# Patient Record
Sex: Male | Born: 2000 | Race: Black or African American | Hispanic: No | Marital: Single | Smoking: Current every day smoker
Health system: Southern US, Community
[De-identification: ages and names within clinical notes are randomized; demographics above are authoritative.]

## PROBLEM LIST (undated history)

## (undated) DIAGNOSIS — R509 Fever, unspecified: Secondary | ICD-10-CM

## (undated) HISTORY — PX: SKIN GRAFT: SHX250

---
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
## 2022-01-12 ENCOUNTER — Ambulatory Visit (INDEPENDENT_AMBULATORY_CARE_PROVIDER_SITE_OTHER): Payer: Medicaid Other | Admitting: Pediatric Endocrinology

## 2022-02-11 ENCOUNTER — Other Ambulatory Visit (INDEPENDENT_AMBULATORY_CARE_PROVIDER_SITE_OTHER): Payer: Self-pay | Admitting: Pediatric Endocrinology

## 2022-02-24 ENCOUNTER — Encounter: Payer: Self-pay | Admitting: *Deleted

## 2022-03-06 ENCOUNTER — Other Ambulatory Visit (INDEPENDENT_AMBULATORY_CARE_PROVIDER_SITE_OTHER): Payer: Self-pay | Admitting: Pediatric Endocrinology

## 2022-03-06 DIAGNOSIS — E1110 Type 2 diabetes mellitus with ketoacidosis without coma: Secondary | ICD-10-CM

## 2022-03-29 ENCOUNTER — Other Ambulatory Visit (INDEPENDENT_AMBULATORY_CARE_PROVIDER_SITE_OTHER): Payer: Self-pay | Admitting: Pediatric Endocrinology

## 2022-03-29 DIAGNOSIS — E1110 Type 2 diabetes mellitus with ketoacidosis without coma: Secondary | ICD-10-CM

## 2022-04-21 ENCOUNTER — Other Ambulatory Visit: Payer: Self-pay

## 2022-04-21 ENCOUNTER — Encounter (INDEPENDENT_AMBULATORY_CARE_PROVIDER_SITE_OTHER): Payer: Self-pay | Admitting: Pediatric Endocrinology

## 2022-04-21 ENCOUNTER — Ambulatory Visit (INDEPENDENT_AMBULATORY_CARE_PROVIDER_SITE_OTHER): Payer: Medicaid Other | Admitting: Pediatric Endocrinology

## 2022-04-21 ENCOUNTER — Other Ambulatory Visit: Payer: Self-pay | Admitting: Family

## 2022-04-21 VITALS — BP 120/80 | HR 96 | Ht 68.5 in | Wt 150.0 lb

## 2022-04-21 DIAGNOSIS — Z113 Encounter for screening for infections with a predominantly sexual mode of transmission: Secondary | ICD-10-CM

## 2022-04-21 DIAGNOSIS — E11649 Type 2 diabetes mellitus with hypoglycemia without coma: Secondary | ICD-10-CM

## 2022-04-21 DIAGNOSIS — E119 Type 2 diabetes mellitus without complications: Secondary | ICD-10-CM

## 2022-04-21 LAB — POCT GLYCOSYLATED HEMOGLOBIN (HGB A1C): Hemoglobin A1C: 5.4 % (ref 4.0–5.6)

## 2022-04-21 LAB — POCT GLUCOSE (DEVICE FOR HOME USE): POC Glucose: 115 mg/dl — AB (ref 70–99)

## 2022-04-21 MED ORDER — BAQSIMI TWO PACK 3 MG/DOSE NA POWD: 1.0000 | NASAL | 3 refills | Status: AC | PRN

## 2022-04-21 MED ORDER — TRULICITY 3 MG/0.5ML ~~LOC~~ SOAJ
3.0000 mg | SUBCUTANEOUS | 5 refills | Status: DC
Start: 2022-04-21 — End: 2022-09-23

## 2022-04-21 NOTE — Progress Notes (Signed)
Subjective:  Subjective  Patient Name: Rodney Perez Date of Birth: 2001-09-14  MRN: [id number]  Rodney Perez  presents to the office today for follow-upevaluation and management of his new onset diabetes.   HISTORY OF PRESENT ILLNESS:   Rodney Perez is a 21 y.o. male   [name] was unaccompanied to his visit today  1. Rodney Perez was admitted to [facility] on 12/02/19. He had lost about 20 pounds over the prior 3-4 months. He was seen in the ED at [facility] on 12/01/19 for jaw pain- where his glucose was 594 with 20 ketones on UA. He was given 10 units of Novolog in the ED and discharged on Metformin 500 BID. He returned to the ED later the same day due to concerns for hyperglycemia and was transferred to [facility] for evaluation of New Onset diabetes.   2. Rodney Perez was last seen in pediatric endocrine clinic on 06/23/21 In the interim he has been doing well.   He has continued on Trulicity 1.5 mg weekly. He has enjoyed being on GLP-1 therapy instead of insulin.   He has continued on Dexcom G6. He is having issues with dress code at his job.   He is no longer having issues with urinating at night.    3. Pertinent Review of Systems:  Constitutional: The patient feels "fine". The patient seems healthy and active. Eyes: Vision seems to be good. There are no recognized eye problems.  Neck: The patient has no complaints of anterior neck swelling, soreness, tenderness, pressure, discomfort, or difficulty swallowing.   Heart: Heart rate increases with exercise or other physical activity. The patient has no complaints of palpitations, irregular heart beats, chest pain, or chest pressure.   Lungs: No asthma or wheezing. Gastrointestinal: Bowel movents seem normal. The patient has no complaints of excessive hunger, acid reflux, upset stomach, stomach aches or pains, diarrhea, or constipation.  Legs: Muscle mass and strength seem normal. There are no complaints of numbness, tingling, burning, or pain. No  edema is noted.  Feet: There are no obvious foot problems. There are no complaints of numbness, tingling, burning, or pain. No edema is noted. Neurologic: There are no recognized problems with muscle movement and strength, sensation, or coordination. GYN/GU: No polyuria. He is getting up once a night to urinate. No polydipsia  Diabetes alert:none - not yet.  Annual labs March 2022- no issues. Robust C-Peptide Hypoglycemia none        PAST MEDICAL, FAMILY, AND SOCIAL HISTORY   Past Medical History:  Diagnosis Date   Fever in pediatric patient    fevers of unknown origin (multiple episodes)   Neonatal abstinence syndrome     Family History  Problem Relation Age of Onset   Drug abuse Mother    Diabetes Mother    Heart Problems Mother      Current Outpatient Medications:    Continuous Blood Gluc Sensor (DEXCOM G6 SENSOR) MISC, QPPLY 1 SENSOR EVERY 10 DAYS AS DIRECTED, Disp: 3 each, Rfl: 11   Continuous Blood Gluc Transmit (DEXCOM G6 TRANSMITTER) MISC, USE EVERY 3 MONTHS, Disp: 1 each, Rfl: 0   Dulaglutide (TRULICITY) 3 IO/2.7OJ SOPN, Inject 3 mg into the skin once a week., Disp: 2 mL, Rfl: 5   Accu-Chek FastClix Lancets MISC, USE AS DIRECTED TO CHECK BLOOD SUGAR (Patient not taking: Reported on 04/21/2022), Disp: 204 each, Rfl: 5   acetone, urine, test strip, Check ketones per protocol (Patient not taking: Reported on 12/16/2020), Disp: 50 each, Rfl: 3   blood  glucose meter kit and supplies KIT, Dispense based on patient and insurance preference. Use up to four times daily as directed. (FOR ICD-9 250.00, 250.01). (Patient not taking: Reported on 04/21/2022), Disp: 1 each, Rfl: 0   Blood Glucose Monitoring Suppl (ACCU-CHEK GUIDE) w/Device KIT, 1 each by Does not apply route as directed. (Patient not taking: Reported on 04/21/2022), Disp: 1 kit, Rfl: 1   Glucagon ([name] TWO PACK) 3 MG/DOSE POWD, Place 1 each into the nose as needed (severe hypoglycmia with unresponsiveness)., Disp: 1  each, Rfl: 3   glucose blood (ACCU-CHEK GUIDE) test strip, USE TO CHECK BLOOD SUGAR 6 TIMES A DAY (Patient not taking: Reported on 04/21/2022), Disp: 200 strip, Rfl: 0   insulin lispro (HUMALOG KWIKPEN) 100 UNIT/ML KwikPen, INJECT UP TO 50 UNITS PER DAY (Patient not taking: Reported on 06/23/2021), Disp: 15 mL, Rfl: 5   LANTUS SOLOSTAR 100 UNIT/ML Solostar Pen, ADMINISTER UP TO 50 UNITS UNDER THE SKIN EVERY DAY (Patient not taking: Reported on 06/23/2021), Disp: 15 mL, Rfl: 5   metFORMIN (GLUCOPHAGE-XR) 500 MG 24 hr tablet, Take 1 tablet (500 mg total) by mouth daily with breakfast. (Patient not taking: Reported on 04/21/2022), Disp: 90 tablet, Rfl: 2  Allergies as of 04/21/2022 - Review Complete 04/21/2022  Allergen Reaction Noted   Cyproheptadine Other (See Comments) 01/22/2012     reports that he has been smoking cigarettes and e-cigarettes. He has been exposed to tobacco smoke. He has never used smokeless tobacco. He reports current drug use. Drug: Marijuana. He reports that he does not drink alcohol. Pediatric History  Patient Parents   CARTHEN,CHRISTOPHER (Father)   Carthen,Benita (Mother)   Other Topics Concern   Not on file  Social History Narrative   Lives at home with adoptive mom, mom's 2yo foster son, mom's 2 grandchildren (intermittently).  No pets in home.     1. School and Family: Graduated HS. Wants to be at [employer].  2. Activities: not active   3. Primary Care Provider: Wells Guiles, DO  ROS: There are no other significant problems involving Eliot's other body systems.    Objective:  Objective  Vital Signs:   BP 120/80 (BP Location: Left Arm, Patient Position: Sitting, Cuff Size: Large)   Pulse 96   Ht 5' 8.5" (1.74 m)   Wt 150 lb (68 kg)   BMI 22.47 kg/m    Blood pressure %iles are not available for patients who are 18 years or older.  Ht Readings from Last 3 Encounters:  04/21/22 5' 8.5" (1.74 m) (35 %, Z= -0.38)*  02/07/20 5' 8.62" (1.743 m) (43 %, Z= -0.19)*   01/03/20 5' 8.78" (1.747 m) (45 %, Z= -0.12)*   * Growth percentiles are based on CDC (Boys, 2-20 Years) data.   Wt Readings from Last 3 Encounters:  04/21/22 150 lb (68 kg) (43 %, Z= -0.18)*  06/23/21 158 lb (71.7 kg) (61 %, Z= 0.27)*  12/16/20 176 lb 9.6 oz (80.1 kg) (83 %, Z= 0.96)*   * Growth percentiles are based on CDC (Boys, 2-20 Years) data.   HC Readings from Last 3 Encounters:  No data found for [facility]   Body surface area is 1.81 meters squared. 35 %ile (Z= -0.38) based on CDC (Boys, 2-20 Years) Stature-for-age data based on Stature recorded on 04/21/2022. 43 %ile (Z= -0.18) based on CDC (Boys, 2-20 Years) weight-for-age data using vitals from 04/21/2022.  PHYSICAL EXAM:    Constitutional: The patient appears healthy and well nourished. The patient's weight  is decreased 18 pounds since March (6 months).  Head: The head is normocephalic. Face: The face appears normal. There are no obvious dysmorphic features. Eyes: The eyes appear to be normally formed and spaced. Gaze is conjugate. There is no obvious arcus or proptosis. Moisture appears normal. Ears: The ears are normally placed and appear externally normal. Neck: The neck appears to be visibly normal. The consistency of the thyroid gland is normal. The thyroid gland is not tender to palpation. Lungs: No increased work of breathing Heart: Heart rate, pulses, and peripheral perfusion normal Abdomen: The abdomen appears to be normal in size for the patient's age. Bowel sounds are normal. There is no obvious hepatomegaly, splenomegaly, or other mass effect.  Arms: Muscle size and bulk are normal for age. Hands: There is no obvious tremor. Phalangeal and metacarpophalangeal joints are normal. Palmar muscles are normal for age. Palmar skin is normal. Palmar moisture is also normal. Legs: Muscles appear normal for age. No edema is present. Feet: Feet are normally formed. Dorsalis pedal pulses are normal. Neurologic: Strength is normal  for age in both the upper and lower extremities. Muscle tone is normal. Sensation to touch is normal in both the legs and feet.    LAB DATA:   Lab Results  Component Value Date   HGBA1C 5.4 04/21/2022   HGBA1C 5.2 06/23/2021   HGBA1C 5.6 12/02/2020   HGBA1C >15.5 (H) 12/02/2019     Results for orders placed or performed in visit on 04/21/22  POCT Glucose (Device for Home Use)  Result Value Ref Range   Glucose Fasting, POC     POC Glucose 115 (A) 70 - 99 mg/dl  POCT glycosylated hemoglobin (Hb A1C)  Result Value Ref Range   Hemoglobin A1C 5.4 4.0 - 5.6 %   HbA1c POC (<> result, manual entry)     HbA1c, POC (prediabetic range)     HbA1c, POC (controlled diabetic range)           Assessment and Plan:  Assessment  ASSESSMENT: Jeremy is a 21 y.o. male with antibody negative diabetes. He was previously managed with insulin. However due to robust C-Peptide we transitioned him to GLP-1 therapy in March 2022.   Type 2 Diabetes - Reviewed Dexcom CGM in clinic today. His sugars have been more variable with a higher percentage above 180 (2% -> 4%) compared with his last visit. Will increase dose of Trulicity.  - Trulicity 1.5 mg weekly -> 41m weekly - POC CBG as above - POC A1C as above  Concern about STI - Will arrange for him to go to adolescent clinic today for STI testing - Will hold off on annual labs until next visit.   PLAN:   1. Diagnostic: CBG and A1C as above. Annual labs above from March 2022- will hold on labs as he is going to adolescent med for STI testing.  2. Therapeutic:  Meds ordered this encounter  Medications   Glucagon ([name] TWO PACK) 3 MG/DOSE POWD    Sig: Place 1 each into the nose as needed (severe hypoglycmia with unresponsiveness).    Dispense:  1 each    Refill:  3   Dulaglutide (TRULICITY) 3 MIB/7.0WUSOPN    Sig: Inject 3 mg into the skin once a week.    Dispense:  2 mL    Refill:  5    3. Patient education: Discussion as above.  4.  Follow-up: Return in about 3 months (around 07/22/2022).     JAnderson Malta  Baldo Ash, MD   LOS >30 minutes spent today reviewing the medical chart, counseling the patient/family, and documenting today's encounter.   Patient referred by Wells Guiles, DO for diabetes  Copy of this note sent to Wells Guiles, DO

## 2022-04-22 ENCOUNTER — Encounter (INDEPENDENT_AMBULATORY_CARE_PROVIDER_SITE_OTHER): Payer: Self-pay

## 2022-04-22 LAB — C. TRACHOMATIS/N. GONORRHOEAE RNA
C. trachomatis RNA, TMA: NOT DETECTED
N. gonorrhoeae RNA, TMA: NOT DETECTED

## 2022-04-25 ENCOUNTER — Other Ambulatory Visit (INDEPENDENT_AMBULATORY_CARE_PROVIDER_SITE_OTHER): Payer: Self-pay | Admitting: Pediatric Endocrinology

## 2022-04-25 DIAGNOSIS — E1110 Type 2 diabetes mellitus with ketoacidosis without coma: Secondary | ICD-10-CM

## 2022-04-28 ENCOUNTER — Ambulatory Visit: Payer: Medicaid Other | Admitting: Family

## 2022-05-24 ENCOUNTER — Other Ambulatory Visit (INDEPENDENT_AMBULATORY_CARE_PROVIDER_SITE_OTHER): Payer: Self-pay | Admitting: Pediatric Endocrinology

## 2022-05-28 ENCOUNTER — Other Ambulatory Visit (INDEPENDENT_AMBULATORY_CARE_PROVIDER_SITE_OTHER): Payer: Self-pay | Admitting: Pediatric Endocrinology

## 2022-05-28 DIAGNOSIS — E109 Type 1 diabetes mellitus without complications: Secondary | ICD-10-CM

## 2022-05-28 MED ORDER — ACCU-CHEK GUIDE VI STRP: ORAL_STRIP | 11 refills | Status: AC

## 2022-06-01 ENCOUNTER — Telehealth (INDEPENDENT_AMBULATORY_CARE_PROVIDER_SITE_OTHER): Payer: Self-pay

## 2022-06-01 NOTE — Telephone Encounter (Signed)
Received faxes from covermymeds stating that PA's for Dexcom G6 Sensors and Receiver is going to expire soon. Initiated PA's in covermymeds:  Receiver: Key: [id number]     Sensors:  Key: [id number]

## 2022-06-02 NOTE — Telephone Encounter (Addendum)
Receiver Key: [id number] APPROVED     Sensors Key: [id number]  APPROVED thru 06/01/2023

## 2022-07-22 ENCOUNTER — Telehealth (INDEPENDENT_AMBULATORY_CARE_PROVIDER_SITE_OTHER): Payer: Self-pay | Admitting: Pediatric Endocrinology

## 2022-07-22 ENCOUNTER — Ambulatory Visit (INDEPENDENT_AMBULATORY_CARE_PROVIDER_SITE_OTHER): Payer: Medicaid Other | Admitting: Pediatric Endocrinology

## 2022-07-22 NOTE — Telephone Encounter (Signed)
Called patient back, clarified that medicaid typically only covers medication in the state that it is issued.  He would need to call them to clarify if he can get something filled out of state.  He stated that he has moved to [place] but can get back to  to fill it if needed.  I told him to call back or send a mychart message with the pharmacy information of where he wants his refills sent.  I also told him if he is wanting to get stuff filled in [place], he should reach out to DSS there to see about getting VA medicaid or insurance.  He verbalized understanding.  I asked if he was out of medication at this time.  He stated no.

## 2022-07-22 NOTE — Telephone Encounter (Signed)
  Name of who is calling: Rodney Perez  Caller's Relationship to Patient: Self  Best contact number: [phone]  Provider they see: Dr.Badik  Reason for call: Rodney Perez is calling to see if he could get all his prescription in a different states. He's not sure if his insurance approves. Rodney Perez is requesting a callback.      PRESCRIPTION REFILL ONLY  Name of prescription:  Pharmacy:

## 2022-09-23 ENCOUNTER — Encounter (INDEPENDENT_AMBULATORY_CARE_PROVIDER_SITE_OTHER): Payer: Self-pay | Admitting: Pediatric Endocrinology

## 2022-09-23 ENCOUNTER — Telehealth (INDEPENDENT_AMBULATORY_CARE_PROVIDER_SITE_OTHER): Payer: Self-pay | Admitting: Pediatric Endocrinology

## 2022-09-23 DIAGNOSIS — E119 Type 2 diabetes mellitus without complications: Secondary | ICD-10-CM

## 2022-09-23 MED ORDER — TRULICITY 3 MG/0.5ML ~~LOC~~ SOAJ: 3.0000 mg | SUBCUTANEOUS | 5 refills | Status: DC

## 2022-09-23 NOTE — Telephone Encounter (Signed)
Rodney Perez is calling back stating that he really needs his medicine it and its an emergency. Last dose was last week. And the pharmacy messed up.   FYI: call was transferred to [facility]

## 2022-09-23 NOTE — Telephone Encounter (Signed)
Spoke to pt and his sister. They stated that pt never picked up Trulicity at the end of December, yet [facility] is saying they did. Pt is calling because he is now out of medication and doesn't have his next dose of Trulicity. Pt states [facility] to get [facility] fill and they have authorized it, they just need approval from Dr Baldo Ash.  Dispense report states that Trulicity was 'picked up' on 09/16/22 as pt is stating.      [facility], they stated the prescription wasn't at their facility.   Called [facility] in [place] to get update, they stated the prescription issue has been resolved on their end and the pt should be able to get their medication now.  Called pt and told them [facility] had resolved the issue on their end and that I noticed in the phone note by [place] that pt stated they needed the medication from a pharmacy in [place]. I asked him about this and he stated that he needs it from [facility] in [place], [place]. Got the pharmacy address information from the pt, entered the refill electronically and called the [place] [place] [facility] to confirm receipt and to see if it went thru. [facility] stated they are out of stock at the moment but they can have it for tomorrow. And as of our phone call, they had not received the electronic prescription yet. I told them I would have the patient call back later today and check.    Called pt and gave them and update. I told them to call [facility] in [place], [place] to check if they received the electronic prescription as they had not received it yet. I also told them I spoke to Dr Baldo Ash and she stated that it is not an emergency. Pt and pts sister stated understanding and said they will follow up with the pharmacy. We ended the call

## 2022-09-23 NOTE — Telephone Encounter (Signed)
Who's calling (name and relationship to patient) : Rodney Perez  Best contact number:[phone]  Provider they see: Baldo Ash   Reason for call: HE states his insurance approved the medication, the pharmacy in [place] needs the prescription. Insurance did an override until today so the medicine can be processed.    Call ID:      PRESCRIPTION REFILL ONLY  Name of prescription:  Pharmacy:

## 2022-09-24 ENCOUNTER — Telehealth (INDEPENDENT_AMBULATORY_CARE_PROVIDER_SITE_OTHER): Payer: Self-pay | Admitting: Pediatric Endocrinology

## 2022-09-24 NOTE — Telephone Encounter (Signed)
  Name of who is calling: Ikeem  Best contact number: [phone]  Provider they see: Dr. Baldo Ash  Reason for call: Insurance is saying since its a new year then they need you to approve it. He has no insulin.      PRESCRIPTION REFILL ONLY  Name of prescription:   Pharmacy: [facility] in [place] [address] [place]

## 2022-09-24 NOTE — Telephone Encounter (Signed)
Received notification from pt stating that pt needed a PA for his Trulicity. Initiated PA on covermymeds: Key: [id number] - PA Case ID: [id number] - Rx #: [id number]

## 2022-09-24 NOTE — Telephone Encounter (Signed)
TRULICITY APPROVED THRU 09/24/2023

## 2022-10-09 ENCOUNTER — Telehealth (INDEPENDENT_AMBULATORY_CARE_PROVIDER_SITE_OTHER): Payer: Self-pay | Admitting: Pediatric Endocrinology

## 2022-10-09 DIAGNOSIS — E109 Type 1 diabetes mellitus without complications: Secondary | ICD-10-CM

## 2022-10-09 MED ORDER — ACCU-CHEK GUIDE W/DEVICE KIT: 1.0000 | PACK | 1 refills | Status: AC

## 2022-10-09 NOTE — Telephone Encounter (Signed)
  Name of who is calling: Rodney Perez  Best contact number: [phone]  Provider they see: Dr. Baldo Ash  Reason for call: "Patient needs his blood monitor kit approved by his dr." (Accu check) he states he lost his.      PRESCRIPTION REFILL ONLY  Name of prescription:   Pharmacy: [facility] on [address]

## 2022-10-26 ENCOUNTER — Telehealth (INDEPENDENT_AMBULATORY_CARE_PROVIDER_SITE_OTHER): Payer: Self-pay | Admitting: Pediatric Endocrinology

## 2022-10-26 DIAGNOSIS — E119 Type 2 diabetes mellitus without complications: Secondary | ICD-10-CM

## 2022-10-26 MED ORDER — DEXCOM G6 SENSOR MISC: 5 refills | Status: DC

## 2022-10-26 MED ORDER — TRULICITY 3 MG/0.5ML ~~LOC~~ SOAJ: 3.0000 mg | SUBCUTANEOUS | 5 refills | Status: DC

## 2022-10-26 MED ORDER — DEXCOM G6 TRANSMITTER MISC: 1 refills | Status: DC

## 2022-10-26 NOTE — Telephone Encounter (Signed)
  Name of who is calling: Eyad Jelley   Caller's Relationship to Patient:  Best contact number: [phone]  Provider they see: Baldo Ash  Reason for call: Would like medications to be moved to a new pharmacy.      PRESCRIPTION REFILL ONLY  Name of prescription: Trulicity and Dexcom G6  Pharmacy: [address] [place]

## 2022-10-26 NOTE — Telephone Encounter (Signed)
Scripts sent to [facility] at [place]

## 2022-11-17 ENCOUNTER — Ambulatory Visit (INDEPENDENT_AMBULATORY_CARE_PROVIDER_SITE_OTHER): Payer: Medicaid Other | Admitting: Pediatric Endocrinology

## 2022-12-08 ENCOUNTER — Ambulatory Visit (INDEPENDENT_AMBULATORY_CARE_PROVIDER_SITE_OTHER): Payer: Medicaid Other | Admitting: Pediatric Endocrinology

## 2022-12-08 ENCOUNTER — Encounter (INDEPENDENT_AMBULATORY_CARE_PROVIDER_SITE_OTHER): Payer: Self-pay | Admitting: Pediatric Endocrinology

## 2022-12-08 VITALS — BP 124/70 | HR 112 | Ht 68.78 in | Wt 152.6 lb

## 2022-12-08 DIAGNOSIS — E119 Type 2 diabetes mellitus without complications: Secondary | ICD-10-CM

## 2022-12-08 DIAGNOSIS — Z7985 Long-term (current) use of injectable non-insulin antidiabetic drugs: Secondary | ICD-10-CM | POA: Diagnosis not present

## 2022-12-08 DIAGNOSIS — E11649 Type 2 diabetes mellitus with hypoglycemia without coma: Secondary | ICD-10-CM

## 2022-12-08 LAB — POCT GLYCOSYLATED HEMOGLOBIN (HGB A1C): Hemoglobin A1C: 5.4 % (ref 4.0–5.6)

## 2022-12-08 LAB — POCT GLUCOSE (DEVICE FOR HOME USE): Glucose Fasting, POC: 195 mg/dL — AB (ref 70–99)

## 2022-12-08 MED ORDER — DEXCOM G7 SENSOR MISC: 5 refills | Status: DC

## 2022-12-08 MED ORDER — ACCU-CHEK FASTCLIX LANCET KIT: PACK | 1 refills | Status: AC

## 2022-12-08 NOTE — Progress Notes (Signed)
Subjective:  Subjective  Patient Name: Rodney Perez Date of Birth: [birth year]  MRN: [id number]  Rodney Perez  presents to the office today for follow-upevaluation and management of his new onset diabetes.   HISTORY OF PRESENT ILLNESS:   [name] is a 22 y.o. male   [name] was unaccompanied to his visit today  1. [name] was admitted to [facility] on 12/02/19. He had lost about 20 pounds over the prior 3-4 months. He was seen in the ED at [facility] on 12/01/19 for jaw pain- where his glucose was 594 with 20 ketones on UA. He was given 10 units of Novolog in the ED and discharged on Metformin 500 BID. He returned to the ED later the same day due to concerns for hyperglycemia and was transferred to [facility] for evaluation of New Onset diabetes.   2. [name] was last seen in pediatric endocrine clinic on 04/21/22 In the interim he has been doing well.   He is now taking Trulicity 3 mg once a week. He is wearing a Dexcom G6 CGM. He states that he broke his lancet device and needs a new one. He has all the other supplies to check his sugar if his CGM fails.    3. Pertinent Review of Systems:  Constitutional: The patient feels "fine". The patient seems healthy and active. Eyes: Vision seems to be good. There are no recognized eye problems.  Neck: The patient has no complaints of anterior neck swelling, soreness, tenderness, pressure, discomfort, or difficulty swallowing.   Heart: Heart rate increases with exercise or other physical activity. The patient has no complaints of palpitations, irregular heart beats, chest pain, or chest pressure.   Lungs: No asthma or wheezing. Gastrointestinal: Bowel movents seem normal. The patient has no complaints of excessive hunger, acid reflux, upset stomach, stomach aches or pains, diarrhea, or constipation.  Legs: Muscle mass and strength seem normal. There are no complaints of numbness, tingling, burning, or pain. No edema is noted.  Feet: There are no  obvious foot problems. There are no complaints of numbness, tingling, burning, or pain. No edema is noted. Neurologic: There are no recognized problems with muscle movement and strength, sensation, or coordination. GYN/GU: No polyuria. He is getting up once a night to urinate. No polydipsia  Diabetes alert:none - not yet.  Annual labs March 2022- no issues. Robust C-Peptide Hypoglycemia none          PAST MEDICAL, FAMILY, AND SOCIAL HISTORY   Past Medical History:  Diagnosis Date   Fever in pediatric patient    fevers of unknown origin (multiple episodes)   Neonatal abstinence syndrome     Family History  Problem Relation Age of Onset   Drug abuse Mother    Diabetes Mother    Heart Problems Mother      Current Outpatient Medications:    Blood Glucose Monitoring Suppl (ACCU-CHEK GUIDE) w/Device KIT, 1 each by Does not apply route as directed., Disp: 1 kit, Rfl: 1   Continuous Blood Gluc Sensor (DEXCOM G6 SENSOR) MISC, QPPLY 1 SENSOR EVERY 10 DAYS AS DIRECTED, Disp: 3 each, Rfl: 5   Continuous Blood Gluc Transmit (DEXCOM G6 TRANSMITTER) MISC, Use with Dexcom Sensor, reuse for 3 months, Disp: 1 each, Rfl: 1   Dulaglutide (TRULICITY) 3 0000000 SOPN, Inject 3 mg into the skin once a week., Disp: 2 mL, Rfl: 5   Glucagon ([name] TWO PACK) 3 MG/DOSE POWD, Place 1 each into the nose as needed (severe hypoglycmia with unresponsiveness)., Disp:  1 each, Rfl: 3   glucose blood (ACCU-CHEK GUIDE) test strip, USE TO CHECK BLOOD SUGAR 6 TIMES A DAY, Disp: 200 strip, Rfl: 11   Accu-Chek FastClix Lancets MISC, USE AS DIRECTED TO CHECK BLOOD SUGAR (Patient not taking: Reported on 04/21/2022), Disp: 204 each, Rfl: 5   acetone, urine, test strip, Check ketones per protocol (Patient not taking: Reported on 12/16/2020), Disp: 50 each, Rfl: 3   blood glucose meter kit and supplies KIT, Dispense based on patient and insurance preference. Use up to four times daily as directed. (FOR ICD-9 250.00,  250.01). (Patient not taking: Reported on 04/21/2022), Disp: 1 each, Rfl: 0   insulin lispro (HUMALOG KWIKPEN) 100 UNIT/ML KwikPen, INJECT UP TO 50 UNITS PER DAY (Patient not taking: Reported on 06/23/2021), Disp: 15 mL, Rfl: 5   LANTUS SOLOSTAR 100 UNIT/ML Solostar Pen, ADMINISTER UP TO 50 UNITS UNDER THE SKIN EVERY DAY (Patient not taking: Reported on 06/23/2021), Disp: 15 mL, Rfl: 5   metFORMIN (GLUCOPHAGE-XR) 500 MG 24 hr tablet, Take 1 tablet (500 mg total) by mouth daily with breakfast. (Patient not taking: Reported on 04/21/2022), Disp: 90 tablet, Rfl: 2  Allergies as of 12/08/2022 - Review Complete 12/08/2022  Allergen Reaction Noted   Cyproheptadine Other (See Comments) 01/22/2012     reports that he has been smoking cigarettes and e-cigarettes. He has been exposed to tobacco smoke. He has never used smokeless tobacco. He reports current drug use. Drug: Marijuana. He reports that he does not drink alcohol. Pediatric History  Patient Parents   CARTHEN,CHRISTOPHER (Father)   Carthen,Benita (Mother)   Other Topics Concern   Not on file  Social History Narrative   Lives at home with adoptive mom, mom's 2yo foster son, mom's 2 grandchildren (intermittently).  No pets in home.     1. School and Family: Graduated HS. Wants to be at [employer].  2. Activities: not active   3. Primary Care Provider: Wells Guiles, DO  ROS: There are no other significant problems involving Kameron's other body systems.    Objective:  Objective  Vital Signs:   BP 124/70 (BP Location: Left Arm, Patient Position: Sitting, Cuff Size: Large)   Pulse (!) 112   Ht 5' 8.78" (1.747 m)   Wt 152 lb 9.6 oz (69.2 kg)   BMI 22.68 kg/m    Growth %ile SmartLinks can only be used for patients less than 28 years old.  Ht Readings from Last 3 Encounters:  12/08/22 5' 8.78" (1.747 m)  04/21/22 5' 8.5" (1.74 m) (35 %, Z= -0.38)*  02/07/20 5' 8.62" (1.743 m) (43 %, Z= -0.19)*   * Growth percentiles are based on CDC (Boys,  2-20 Years) data.   Wt Readings from Last 3 Encounters:  12/08/22 152 lb 9.6 oz (69.2 kg)  04/21/22 150 lb (68 kg) (43 %, Z= -0.18)*  06/23/21 158 lb (71.7 kg) (61 %, Z= 0.27)*   * Growth percentiles are based on CDC (Boys, 2-20 Years) data.   HC Readings from Last 3 Encounters:  No data found for [facility]   Body surface area is 1.83 meters squared. Facility age limit for growth %iles is 20 years. Facility age limit for growth %iles is 20 years.  PHYSICAL EXAM:    Constitutional: The patient appears healthy and well nourished. The patient's weight is decreased 18 pounds since March (6 months).  Head: The head is normocephalic. Face: The face appears normal. There are no obvious dysmorphic features. Eyes: The eyes appear to be  normally formed and spaced. Gaze is conjugate. There is no obvious arcus or proptosis. Moisture appears normal. Ears: The ears are normally placed and appear externally normal. Neck: The neck appears to be visibly normal. The consistency of the thyroid gland is normal. The thyroid gland is not tender to palpation. Lungs: No increased work of breathing Heart: Heart rate, pulses, and peripheral perfusion normal Abdomen: The abdomen appears to be normal in size for the patient's age. Bowel sounds are normal. There is no obvious hepatomegaly, splenomegaly, or other mass effect.  Arms: Muscle size and bulk are normal for age. Hands: There is no obvious tremor. Phalangeal and metacarpophalangeal joints are normal. Palmar muscles are normal for age. Palmar skin is normal. Palmar moisture is also normal. Legs: Muscles appear normal for age. No edema is present. Feet: Feet are normally formed. Dorsalis pedal pulses are normal. Neurologic: Strength is normal for age in both the upper and lower extremities. Muscle tone is normal. Sensation to touch is normal in both the legs and feet.    LAB DATA:   Lab Results  Component Value Date   HGBA1C 5.4 12/08/2022   HGBA1C 5.4  04/21/2022   HGBA1C 5.2 06/23/2021   HGBA1C 5.6 12/02/2020   HGBA1C >15.5 (H) 12/02/2019     Results for orders placed or performed in visit on 12/08/22  POCT glycosylated hemoglobin (Hb A1C)  Result Value Ref Range   Hemoglobin A1C 5.4 4.0 - 5.6 %   HbA1c POC (<> result, manual entry)     HbA1c, POC (prediabetic range)     HbA1c, POC (controlled diabetic range)    POCT Glucose (Device for Home Use)  Result Value Ref Range   Glucose Fasting, POC 195 (A) 70 - 99 mg/dL   POC Glucose           Assessment and Plan:  Assessment  ASSESSMENT: Square is a 22 y.o. male with antibody negative diabetes. He was previously managed with insulin. However due to robust C-Peptide we transitioned him to GLP-1 therapy in March 2022.   Type 2 Diabetes - Reviewed Dexcom CGM in clinic today. His sugar is slightly more variable than at last visit but no glucose values over 180 mg/dL.  - Trulicity 3mg  weekly - POC CBG as above - POC A1C as above- in healthy target range of <6%  PLAN:   1. Diagnostic: CBG and A1C as above.  2. Therapeutic:  Meds ordered this encounter  Medications   Lancets Misc. (ACCU-CHEK FASTCLIX LANCET) KIT    Sig: Check sugar 6 times daily    Dispense:  1 kit    Refill:  1   Continuous Blood Gluc Sensor (DEXCOM G7 SENSOR) MISC    Sig: Use as directed every 10 days.    Dispense:  3 each    Refill:  5    3. Patient education: Discussion as above.  4. Follow-up: Return in about 3 months (around 03/08/2023).     Lelon Huh, MD   LOS >30 minutes spent today reviewing the medical chart, counseling the patient/family, and documenting today's encounter.    Patient referred by Wells Guiles, DO for diabetes  Copy of this note sent to Wells Guiles, DO

## 2023-02-11 ENCOUNTER — Telehealth (INDEPENDENT_AMBULATORY_CARE_PROVIDER_SITE_OTHER): Payer: Self-pay

## 2023-02-11 DIAGNOSIS — E119 Type 2 diabetes mellitus without complications: Secondary | ICD-10-CM

## 2023-02-11 NOTE — Telephone Encounter (Signed)
Fax received from [facility] needing PA for pts Dexcom G6 Transmitter. Initiated on covermymeds.  KeyGypsy Balsam - PA Case ID: [id number]

## 2023-02-11 NOTE — Telephone Encounter (Signed)
Fax received pts Dexcom G6 Transmitter APPROVED THRU 02/11/2024

## 2023-02-18 ENCOUNTER — Encounter (INDEPENDENT_AMBULATORY_CARE_PROVIDER_SITE_OTHER): Payer: Self-pay | Admitting: Pediatric Endocrinology

## 2023-02-18 DIAGNOSIS — E119 Type 2 diabetes mellitus without complications: Secondary | ICD-10-CM

## 2023-02-18 DIAGNOSIS — E109 Type 1 diabetes mellitus without complications: Secondary | ICD-10-CM

## 2023-02-19 ENCOUNTER — Telehealth (INDEPENDENT_AMBULATORY_CARE_PROVIDER_SITE_OTHER): Payer: Self-pay | Admitting: Pediatrics

## 2023-02-19 ENCOUNTER — Telehealth (INDEPENDENT_AMBULATORY_CARE_PROVIDER_SITE_OTHER): Payer: Self-pay | Admitting: Pediatric Endocrinology

## 2023-02-19 MED ORDER — OZEMPIC (1 MG/DOSE) 4 MG/3ML ~~LOC~~ SOPN
1.0000 mg | PEN_INJECTOR | SUBCUTANEOUS | 2 refills | Status: DC
Start: 2023-02-19 — End: 2023-05-25

## 2023-02-19 MED ORDER — BLOOD GLUCOSE MONITOR KIT
PACK | 0 refills | Status: AC
Start: 2023-02-19 — End: ?

## 2023-02-19 MED ORDER — OZEMPIC (1 MG/DOSE) 4 MG/3ML ~~LOC~~ SOPN
1.0000 mg | PEN_INJECTOR | SUBCUTANEOUS | 0 refills | Status: DC
Start: 2023-02-19 — End: 2023-02-19

## 2023-02-19 MED ORDER — TRULICITY 3 MG/0.5ML ~~LOC~~ SOAJ
3.0000 mg | SUBCUTANEOUS | 5 refills | Status: DC
Start: 2023-02-19 — End: 2023-02-19

## 2023-02-19 MED ORDER — BLOOD GLUCOSE MONITOR KIT: PACK | 0 refills | Status: DC

## 2023-02-19 MED ORDER — TRULICITY 3 MG/0.5ML ~~LOC~~ SOAJ: 3.0000 mg | SUBCUTANEOUS | 5 refills | Status: DC

## 2023-02-19 MED ORDER — ACETONE (URINE) TEST VI STRP: ORAL_STRIP | 3 refills | Status: DC

## 2023-02-19 NOTE — Addendum Note (Signed)
Addended by: Pollie Friar D on: 02/19/2023 04:55 PM   Modules accepted: Orders

## 2023-02-19 NOTE — Addendum Note (Signed)
Addended by: Morene Antu on: 02/19/2023 02:14 PM   Modules accepted: Orders

## 2023-02-19 NOTE — Telephone Encounter (Signed)
Trulicty on back order. Equivalent to Trulicity 3mg  is Ozempic 1mg . Rx written.  Silvana Newness, MD 02/19/2023

## 2023-02-19 NOTE — Telephone Encounter (Signed)
Ozempic PA intitated on covermymeds Key: [id number]) Rx #: [id number]

## 2023-02-19 NOTE — Telephone Encounter (Signed)
Tried calling pt to go over medication change, had to lvm for them to call me back if needed. Sent pt MyChart message with the medication change details.

## 2023-02-19 NOTE — Telephone Encounter (Signed)
See on call providers note 02/19/23

## 2023-02-19 NOTE — Addendum Note (Signed)
Addended by: Pollie Friar D on: 02/19/2023 01:59 PM   Modules accepted: Orders

## 2023-02-19 NOTE — Telephone Encounter (Signed)
Who's calling (name and relationship to patient) : Gid/self  Best contact number: [phone]  Provider they see: Vanessa [place]  Reason for call: Caller states he needs to put in a request to get his insulin refilled. He also is trying to get 3mg  but the pharmacy only has a lower dose, he needs his doctor to send in a new script for a lower dose. He currently has shortness of breath, dizziness and feels nauseated.  He spoke to the on call doctor @ 6pm after being paged.   Call ID: [id number]     PRESCRIPTION REFILL ONLY  Name of prescription:  Pharmacy:

## 2023-02-19 NOTE — Telephone Encounter (Signed)
Ozempic approved thru 02/19/2024

## 2023-02-19 NOTE — Telephone Encounter (Signed)
[facility] Call:  Received call concerning nausea, dizziness, and hyperglycemia. Current BG 261mg /dL. Out of Trulicity 3mg  x 1.5 weeks. Does not have insulin, but knows how to use it if needed. Wants Rx for Trulicity 1.5mg .  Discussed that I am concerned about impending DKA, and that we need to figure that out to see if he needs Rx for insulin versus evaluation at ED. Does not have ketone test strips.  Recommendations: Advised to go to [facility] for Relion Acetone test strips and to call me back to discuss. If worsening, to go to local ED.  Silvana Newness, MD 02/19/2023 [phone] (Late entry)  Note: as of 0827 patient has not called back.

## 2023-02-20 ENCOUNTER — Telehealth: Payer: Self-pay

## 2023-02-20 DIAGNOSIS — E109 Type 1 diabetes mellitus without complications: Secondary | ICD-10-CM

## 2023-02-20 MED ORDER — ACETONE (URINE) TEST VI STRP
ORAL_STRIP | 3 refills | Status: AC
Start: 2023-02-20 — End: ?

## 2023-02-25 NOTE — Telephone Encounter (Signed)
See mychart encounter started 5/30 for updates

## 2023-03-10 ENCOUNTER — Ambulatory Visit (INDEPENDENT_AMBULATORY_CARE_PROVIDER_SITE_OTHER): Payer: Medicaid Other | Admitting: Pediatric Endocrinology

## 2023-03-14 ENCOUNTER — Encounter (INDEPENDENT_AMBULATORY_CARE_PROVIDER_SITE_OTHER): Payer: Self-pay | Admitting: Pediatric Endocrinology

## 2023-03-26 ENCOUNTER — Encounter (INDEPENDENT_AMBULATORY_CARE_PROVIDER_SITE_OTHER): Payer: Self-pay

## 2023-04-08 ENCOUNTER — Encounter (INDEPENDENT_AMBULATORY_CARE_PROVIDER_SITE_OTHER): Payer: Self-pay

## 2023-04-26 ENCOUNTER — Encounter (INDEPENDENT_AMBULATORY_CARE_PROVIDER_SITE_OTHER): Payer: Self-pay | Admitting: Pediatric Endocrinology

## 2023-05-25 ENCOUNTER — Ambulatory Visit (INDEPENDENT_AMBULATORY_CARE_PROVIDER_SITE_OTHER): Payer: Medicaid Other | Admitting: Pediatric Endocrinology

## 2023-05-25 ENCOUNTER — Encounter (INDEPENDENT_AMBULATORY_CARE_PROVIDER_SITE_OTHER): Payer: Self-pay | Admitting: Pediatric Endocrinology

## 2023-05-25 VITALS — BP 120/86 | HR 74 | Ht 68.9 in | Wt 152.0 lb

## 2023-05-25 DIAGNOSIS — E119 Type 2 diabetes mellitus without complications: Secondary | ICD-10-CM

## 2023-05-25 DIAGNOSIS — Z7985 Long-term (current) use of injectable non-insulin antidiabetic drugs: Secondary | ICD-10-CM

## 2023-05-25 LAB — POCT GLYCOSYLATED HEMOGLOBIN (HGB A1C)

## 2023-05-25 LAB — POCT GLUCOSE (DEVICE FOR HOME USE): POC Glucose: 110 mg/dL — AB (ref 70–99)

## 2023-05-25 MED ORDER — TRULICITY 3 MG/0.5ML ~~LOC~~ SOAJ: 3.0000 mg | SUBCUTANEOUS | 1 refills | Status: AC

## 2023-05-25 MED ORDER — DEXCOM G7 SENSOR MISC: 5 refills | Status: AC

## 2023-05-25 NOTE — Progress Notes (Signed)
Subjective:  Subjective  Patient Name: Rodney Perez Date of Birth: 28-Apr-2001  MRN: [id number]  Rodney Perez  presents to the office today for follow-upevaluation and management of his new onset diabetes.   HISTORY OF PRESENT ILLNESS:   Rodney Perez is a 22 y.o. male   Rodney Perez was accompanied by his friend Rodney Perez to his visit today  1. Rodney Perez was admitted to [facility] on 12/02/19. He had lost about 20 pounds over the prior 3-4 months. He was seen in the ED at [facility] on 12/01/19 for jaw pain- where his glucose was 594 with 20 ketones on UA. He was given 10 units of Novolog in the ED and discharged on Metformin 500 BID. He returned to the ED later the same day due to concerns for hyperglycemia and was transferred to [facility] for evaluation of New Onset diabetes.   2. Rodney Perez was last seen in pediatric endocrine clinic on 12/08/22 In the interim he has been doing well.   He is now taking Trulicity 3 mg once a week. He is wearing a Dexcom G7 CGM.   He has back up supplies if his CGM fails.   He has no concerns today about his BG or his diabetes.   He feels that he is eating better now and his stress is decreased.   3. Pertinent Review of Systems:  Constitutional: The patient feels "fine". The patient seems healthy and active. Eyes: Vision seems to be good. There are no recognized eye problems.  Neck: The patient has no complaints of anterior neck swelling, soreness, tenderness, pressure, discomfort, or difficulty swallowing.   Heart: Heart rate increases with exercise or other physical activity. The patient has no complaints of palpitations, irregular heart beats, chest pain, or chest pressure.   Lungs: No asthma or wheezing. Gastrointestinal: Bowel movents seem normal. The patient has no complaints of excessive hunger, acid reflux, upset stomach, stomach aches or pains, diarrhea, or constipation.  Legs: Muscle mass and strength seem normal. There are no complaints of numbness, tingling,  burning, or pain. No edema is noted.  Feet: There are no obvious foot problems. There are no complaints of numbness, tingling, burning, or pain. No edema is noted. Neurologic: There are no recognized problems with muscle movement and strength, sensation, or coordination. GYN/GU: No polyuria. He is getting up once a night to urinate. No polydipsia  Diabetes alert:none - not yet.  Annual labs August 2024- at [facility]- no issues  Hypoglycemia - sugar drops at night to the 80s but no hypoglycemia.       PAST MEDICAL, FAMILY, AND SOCIAL HISTORY   Past Medical History:  Diagnosis Date   Fever in pediatric patient    fevers of unknown origin (multiple episodes)   Neonatal abstinence syndrome     Family History  Problem Relation Age of Onset   Drug abuse Mother    Diabetes Mother    Heart Problems Mother      Current Outpatient Medications:    Accu-Chek FastClix Lancets MISC, USE AS DIRECTED TO CHECK BLOOD SUGAR, Disp: 204 each, Rfl: 5   blood glucose meter kit and supplies KIT, Dispense based on patient and insurance preference. Use up to four times daily as directed. (FOR ICD-9 250.00, 250.01)., Disp: 1 each, Rfl: 0   Blood Glucose Monitoring Suppl (ACCU-CHEK GUIDE) w/Device KIT, 1 each by Does not apply route as directed., Disp: 1 kit, Rfl: 1   Dulaglutide (TRULICITY) 3 MG/0.5ML SOPN, Inject 3 mg into the skin once a  week., Disp: 6 mL, Rfl: 1   acetone, urine, test strip, Check ketones per protocol, Disp: 50 each, Rfl: 3   Continuous Glucose Sensor (DEXCOM G7 SENSOR) MISC, Use as directed every 10 days., Disp: 3 each, Rfl: 5   Glucagon ([name] TWO PACK) 3 MG/DOSE POWD, Place 1 each into the nose as needed (severe hypoglycmia with unresponsiveness)., Disp: 1 each, Rfl: 3   glucose blood (ACCU-CHEK GUIDE) test strip, USE TO CHECK BLOOD SUGAR 6 TIMES A DAY, Disp: 200 strip, Rfl: 11   Lancets Misc. (ACCU-CHEK FASTCLIX LANCET) KIT, Check sugar 6 times daily, Disp: 1 kit, Rfl: 1  Allergies  as of 05/25/2023 - Review Complete 05/25/2023  Allergen Reaction Noted   Cyproheptadine Other (See Comments) 01/22/2012     reports that he has been smoking cigarettes and e-cigarettes. He has been exposed to tobacco smoke. He has never used smokeless tobacco. He reports current drug use. Drug: Marijuana. He reports that he does not drink alcohol. Pediatric History  Patient Parents   CARTHEN,CHRISTOPHER (Father)   Carthen,Benita (Mother)   Other Topics Concern   Not on file  Social History Narrative   Lives at home with adoptive mom, mom's 2yo foster son, mom's 2 grandchildren (intermittently).  No pets in home.    Not in school.    1. School and Family: Graduated HS. Has not yet started college. Working at [employer]. Switching to [employer] 2. Activities: not active   3. Primary Care Provider: Shelby Mattocks, DO  ROS: There are no other significant problems involving Camdon's other body systems.    Objective:  Objective  Vital Signs:    BP 120/86   Pulse 74   Ht 5' 8.9" (1.75 m)   Wt 152 lb (68.9 kg)   SpO2 98%   BMI 22.51 kg/m    Growth %ile SmartLinks can only be used for patients less than 90 years old.  Ht Readings from Last 3 Encounters:  05/25/23 5' 8.9" (1.75 m)  12/08/22 5' 8.78" (1.747 m)  04/21/22 5' 8.5" (1.74 m) (35%, Z= -0.38)*   * Growth percentiles are based on CDC (Boys, 2-20 Years) data.   Wt Readings from Last 3 Encounters:  05/25/23 152 lb (68.9 kg)  12/08/22 152 lb 9.6 oz (69.2 kg)  04/21/22 150 lb (68 kg) (43%, Z= -0.18)*   * Growth percentiles are based on CDC (Boys, 2-20 Years) data.   HC Readings from Last 3 Encounters:  No data found for [facility]   Body surface area is 1.83 meters squared. Facility age limit for growth %iles is 20 years. Facility age limit for growth %iles is 20 years.  PHYSICAL EXAM:     Constitutional: The patient appears healthy and well nourished. The patient's weight is stable Head: The head is normocephalic. Face: The face  appears normal. There are no obvious dysmorphic features. Eyes: The eyes appear to be normally formed and spaced. Gaze is conjugate. There is no obvious arcus or proptosis. Moisture appears normal. Ears: The ears are normally placed and appear externally normal. Neck: The neck appears to be visibly normal. The consistency of the thyroid gland is normal. The thyroid gland is not tender to palpation. Lungs: No increased work of breathing Heart: Heart rate, pulses, and peripheral perfusion normal Abdomen: The abdomen appears to be normal in size for the patient's age. Bowel sounds are normal. There is no obvious hepatomegaly, splenomegaly, or other mass effect.  Arms: Muscle size and bulk are normal for age. Hands: There  is no obvious tremor. Phalangeal and metacarpophalangeal joints are normal. Palmar muscles are normal for age. Palmar skin is normal. Palmar moisture is also normal. Legs: Muscles appear normal for age. No edema is present. Feet: Feet are normally formed. Dorsalis pedal pulses are normal. Neurologic: Strength is normal for age in both the upper and lower extremities. Muscle tone is normal. Sensation to touch is normal in both the legs and feet.    LAB DATA:   Lab Results  Component Value Date   HGBA1C 5.4 12/08/2022   HGBA1C 5.4 04/21/2022   HGBA1C 5.2 06/23/2021   HGBA1C 5.6 12/02/2020   HGBA1C >15.5 (H) 12/02/2019    Results for orders placed or performed in visit on 05/25/23  POCT Glucose (Device for Home Use)  Result Value Ref Range   Glucose Fasting, POC     POC Glucose 110 (A) 70 - 99 mg/dl  POCT glycosylated hemoglobin (Hb A1C)  Result Value Ref Range   Hemoglobin A1C     HbA1c POC (<> result, manual entry)     HbA1c, POC (prediabetic range)     HbA1c, POC (controlled diabetic range)            Assessment and Plan:  Assessment  ASSESSMENT: Akeil is a 22 y.o. male with antibody negative diabetes. He was previously managed with insulin. However due to  robust C-Peptide we transitioned him to GLP-1 therapy in March 2022.   Type 2 Diabetes - Reviewed Dexcom CGM in clinic today. His sugar is very stable. - Decreased low alarm on his Dexcom - Trulicity 3mg  weekly - POC CBG as above - POC A1C as above- in healthy target range of <6%  PLAN:   1. Diagnostic: CBG and A1C as above.  2. Therapeutic:  Meds ordered this encounter  Medications   Dulaglutide (TRULICITY) 3 MG/0.5ML SOPN    Sig: Inject 3 mg into the skin once a week.    Dispense:  6 mL    Refill:  1   Continuous Glucose Sensor (DEXCOM G7 SENSOR) MISC    Sig: Use as directed every 10 days.    Dispense:  3 each    Refill:  5    3. Patient education: Discussion as above.  4. Follow-up: No follow-ups on file.   Referred to adult endocrinology by [facility].    Dessa Phi, MD   LOS >30 minutes spent today reviewing the medical chart, counseling the patient/family, and documenting today's encounter.   Patient referred by Shelby Mattocks, DO for diabetes  Copy of this note sent to Shelby Mattocks, DO

## 2023-05-28 ENCOUNTER — Telehealth (INDEPENDENT_AMBULATORY_CARE_PROVIDER_SITE_OTHER): Payer: Self-pay

## 2023-05-28 NOTE — Telephone Encounter (Signed)
Received fax from pharmacy/covermymeds to complete prior authorization initiated on covermymeds, completed prior authorization       Pharmacy would like notification of determination [facility]   P:  [phone] F:   [phone]

## 2023-05-31 NOTE — Telephone Encounter (Signed)
   Determination sent to pharmacy
